# Patient Record
Sex: Female | Born: 2005 | Race: White | Hispanic: No | Marital: Single | Smoking: Never smoker
Health system: Southern US, Community
[De-identification: ages and names within clinical notes are randomized; demographics above are authoritative.]

## PROBLEM LIST (undated history)

## (undated) DIAGNOSIS — N390 Urinary tract infection, site not specified: Secondary | ICD-10-CM

## (undated) HISTORY — PX: NO PAST SURGERIES: SHX2092

## (undated) HISTORY — DX: Urinary tract infection, site not specified: N39.0

---
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
## 2023-01-14 ENCOUNTER — Ambulatory Visit
Admission: RE | Admit: 2023-01-14 | Discharge: 2023-01-14 | Disposition: A | Discharge status: Home / Self Care | Payer: Medicaid Other | Source: Ambulatory Visit | Attending: Nurse Practitioner | Admitting: Nurse Practitioner

## 2023-01-14 VITALS — BP 108/78 | HR 92 | Temp 99.9°F | Resp 18

## 2023-01-14 DIAGNOSIS — J029 Acute pharyngitis, unspecified: Secondary | ICD-10-CM | POA: Diagnosis not present

## 2023-01-14 DIAGNOSIS — J069 Acute upper respiratory infection, unspecified: Secondary | ICD-10-CM

## 2023-01-14 LAB — GROUP A STREP BY PCR: Group A Strep by PCR: NOT DETECTED

## 2023-01-14 MED ORDER — IBUPROFEN 800 MG PO TABS: 800.0000 mg | ORAL_TABLET | Freq: Three times a day (TID) | ORAL | 0 refills | Status: AC

## 2023-01-14 MED ORDER — AMOXICILLIN 500 MG PO CAPS: 500.0000 mg | ORAL_CAPSULE | Freq: Two times a day (BID) | ORAL | 0 refills | Status: DC

## 2023-01-14 NOTE — ED Provider Notes (Signed)
[facility]    CSN: [id number] Arrival date & time: 01/14/23  1241      History   Chief Complaint Chief Complaint  Patient presents with   Sore Throat    My throat hurts really bad on the left side - Entered by patient    HPI Olivia Bryan is a 17 y.o. female.   Patient presents for evaluation of fever, chills and sore throat beginning 1 day ago.  Fever peaking at 101.0.  Able to tolerate food and liquids but left ear pain is elicited when swallowing.  Attempted use of BC powder which was effective and fever management.  No known sick contacts.  Denies congestion, cough, abdominal symptoms.   Past Medical History:  Diagnosis Date   Urinary tract infection    last seen for infection last week at [facility], put on ABX    Patient Active Problem List   Diagnosis Date Noted   Labor and delivery, indication for care 09/28/2021   Uterine contractions during pregnancy 09/08/2021    Past Surgical History:  Procedure Laterality Date   NO PAST SURGERIES      OB History     Gravida  1   Para      Term      Preterm      AB      Living         SAB      IAB      Ectopic      Multiple      Live Births               Home Medications    Prior to Admission medications   Medication Sig Start Date End Date Taking? Authorizing Provider  ibuprofen (ADVIL) 600 MG tablet Take 1 tablet (600 mg total) by mouth every 6 (six) hours. 09/30/21   Doreene Burke, CNM  Norethindrone Acetate-Ethinyl Estrad-FE (LOESTRIN 24 FE) 1-20 MG-MCG(24) tablet Take 1 tablet by mouth daily. 11/12/21   Glenetta Borg, CNM  Prenatal Vit-Fe Fumarate-FA (PRENATAL PO) Take by mouth.    [provider]    Family History Family History  Problem Relation Age of Onset   Thyroid disease Mother    Other Mother        palpitations   Learning disabilities Father    Hypertension Father     Social History Social History   Tobacco Use   Smoking status: Never     Passive exposure: Yes   Smokeless tobacco: Never  Vaping Use   Vaping Use: Every day  Substance Use Topics   Alcohol use: No    Alcohol/week: 0.0 standard drinks of alcohol   Drug use: No     Allergies   Patient has no known allergies.   Review of Systems Review of Systems  Constitutional:  Positive for chills and fever. Negative for activity change, appetite change, diaphoresis, fatigue and unexpected weight change.  HENT:  Positive for ear pain and sore throat. Negative for congestion, dental problem, drooling, ear discharge, facial swelling, hearing loss, mouth sores, nosebleeds, postnasal drip, rhinorrhea, sinus pressure, sinus pain, sneezing, tinnitus, trouble swallowing and voice change.   Respiratory: Negative.    Cardiovascular: Negative.   Gastrointestinal: Negative.      Physical Exam Triage Vital Signs ED Triage Vitals  Enc Vitals Group     BP 01/14/23 1257 108/78     Pulse Rate 01/14/23 1257 92     Resp 01/14/23 1257 18  Temp 01/14/23 1257 99.9 F (37.7 C)     Temp Source 01/14/23 1257 Oral     SpO2 01/14/23 1257 100 %     Weight --      Height --      Head Circumference --      Peak Flow --      Pain Score 01/14/23 1256 8     Pain Loc --      Pain Edu? --      Excl. in GC? --    No data found.  Updated Vital Signs BP 108/78 (BP Location: Left Arm)   Pulse 92   Temp 99.9 F (37.7 C) (Oral)   Resp 18   LMP 01/07/2023   SpO2 100%   Visual Acuity Right Eye Distance:   Left Eye Distance:   Bilateral Distance:    Right Eye Near:   Left Eye Near:    Bilateral Near:     Physical Exam Constitutional:      Appearance: Normal appearance. She is well-developed.  HENT:     Head: Normocephalic.     Right Ear: Tympanic membrane and ear canal normal.     Left Ear: Tympanic membrane and ear canal normal.     Nose: No congestion or rhinorrhea.     Mouth/Throat:     Mouth: Mucous membranes are moist.     Pharynx: Posterior oropharyngeal erythema  present.     Tonsils: Tonsillar exudate present. 1+ on the right. 1+ on the left.  Cardiovascular:     Rate and Rhythm: Normal rate and regular rhythm.     Pulses: Normal pulses.     Heart sounds: Normal heart sounds.  Pulmonary:     Effort: Pulmonary effort is normal.     Breath sounds: Normal breath sounds.  Skin:    General: Skin is warm and dry.  Neurological:     General: No focal deficit present.     Mental Status: She is alert and oriented to person, place, and time.      UC Treatments / Results  Labs (all labs ordered are listed, but only abnormal results are displayed) Labs Reviewed  GROUP A STREP BY PCR    EKG   Radiology No results found.  Procedures Procedures (including critical care time)  Medications Ordered in UC Medications - No data to display  Initial Impression / Assessment and Plan / UC Course  I have reviewed the triage vital signs and the nursing notes.  Pertinent labs & imaging results that were available during my care of the patient were reviewed by me and considered in my medical decision making (see chart for details).  Acute upper respiratory infection, sore throat  Vital signs are stable patient is in no signs of distress nor toxic appearing, erythema, tonsillar adenopathy and exudate are present on exam, strep PCR is negative however based on appearance of throat will prophylactically provide bacterial coverage, amoxicillin prescribed as well as ibuprofen 800 mg, discussed additional supportive measures and advised follow-up as needed Final Clinical Impressions(s) / UC Diagnoses   Final diagnoses:  Viral pharyngitis     Discharge Instructions      Your strep test today was negative, symptoms are most likely being caused by a virus and should steadily improve with time  You may gargle and spit Magic mouthwash solution every 4-6 hours as needed to provide temporary relief to your throat  You may use ibuprofen every 8 hours as  needed to provide comfort,  may take this in addition to Tylenol  May attempt  use of salt gargles throat lozenges, warm liquids, teaspoons of honey and over-the-counter clippers septic spray for comfort  You may follow-up at urgent care as needed      ED Prescriptions   None    PDMP not reviewed this encounter.   Valinda Hoar, NP 01/14/23 1341

## 2023-01-14 NOTE — Discharge Instructions (Addendum)
Your strep test today was negative, based on the  Appearance of throat will start antibiotic prophylactically  Begin amoxicillin every morning every evening for 7 days, daily you will see improvement in 48 hours if bacteria is present  You may use ibuprofen every 8 hours as needed to provide comfort, may take this in addition to Tylenol  May attempt  use of salt gargles throat lozenges, warm liquids, teaspoons of honey and over-the-counter clippers septic spray for comfort  You may follow-up at urgent care as needed

## 2023-01-14 NOTE — ED Triage Notes (Signed)
Pt presents with a sore throat that started yesterday. 

## 2023-01-18 ENCOUNTER — Ambulatory Visit
Admission: EM | Admit: 2023-01-18 | Discharge: 2023-01-18 | Disposition: A | Discharge status: Home / Self Care | Payer: Medicaid Other | Attending: Nurse Practitioner | Admitting: Nurse Practitioner

## 2023-01-18 ENCOUNTER — Ambulatory Visit: Payer: Self-pay

## 2023-01-18 DIAGNOSIS — B349 Viral infection, unspecified: Secondary | ICD-10-CM | POA: Diagnosis present

## 2023-01-18 DIAGNOSIS — J029 Acute pharyngitis, unspecified: Secondary | ICD-10-CM | POA: Diagnosis present

## 2023-01-18 DIAGNOSIS — R300 Dysuria: Secondary | ICD-10-CM | POA: Diagnosis present

## 2023-01-18 LAB — URINALYSIS, W/ REFLEX TO CULTURE (INFECTION SUSPECTED)
Glucose, UA: NEGATIVE mg/dL
Ketones, ur: 15 mg/dL — AB
Leukocytes,Ua: NEGATIVE
Nitrite: NEGATIVE
Protein, ur: 100 mg/dL — AB
Specific Gravity, Urine: >1.03 — ABNORMAL HIGH (ref 1.005–1.030)
pH: 6 (ref 5.0–8.0)

## 2023-01-18 MED ORDER — CEPHALEXIN 500 MG PO CAPS: 500.0000 mg | ORAL_CAPSULE | Freq: Two times a day (BID) | ORAL | 0 refills | Status: AC

## 2023-01-18 NOTE — ED Triage Notes (Signed)
Pt c/o chills, headache, fatigue, sore throat onset 01/14/23 pt states her strep test was negtaive and ibuprofen has been helping but still having sore throat.   Pt also c/o dysuria onset Monday last week. Pt states she was taking azo for symptoms.

## 2023-01-18 NOTE — ED Provider Notes (Signed)
[facility]    CSN: [id number] Arrival date & time: 01/18/23  1533      History   Chief Complaint Chief Complaint  Patient presents with   Sore Throat    Possible UTI - Entered by patient   Dysuria    HPI Olivia Bryan is a 17 y.o. female presents for evaluation of sore throat and cough.  Patient was seen in urgent care on 4/18 for 1 day of symptoms.  Had a negative strep PCR but given presentation with started on amoxicillin and ibuprofen.  Patient's been taking as prescribed but reports no improvement in symptoms.  She continues to report a sore throat and thinks she sees white stuff on her tonsils.  Also is endorsing fevers of 101 that comes down to 99 with ibuprofen.  Had some nausea but no vomiting or diarrhea.  In addition 2 days ago she developed urinary burning, urgency, frequency.  No hematuria, flank pain.  No vaginal discharge or STD concern.  No other concerns at this time.   Sore Throat  Dysuria Associated symptoms: fever     Past Medical History:  Diagnosis Date   Urinary tract infection    last seen for infection last week at [facility], put on ABX    Patient Active Problem List   Diagnosis Date Noted   Labor and delivery, indication for care 09/28/2021   Uterine contractions during pregnancy 09/08/2021    Past Surgical History:  Procedure Laterality Date   NO PAST SURGERIES      OB History     Gravida  1   Para      Term      Preterm      AB      Living         SAB      IAB      Ectopic      Multiple      Live Births               Home Medications    Prior to Admission medications   Medication Sig Start Date End Date Taking? Authorizing Provider  cephALEXin (KEFLEX) 500 MG capsule Take 1 capsule (500 mg total) by mouth 2 (two) times daily for 7 days. 01/18/23 01/25/23 Yes Radford Pax, NP  ibuprofen (ADVIL) 800 MG tablet Take 1 tablet (800 mg total) by mouth 3 (three) times daily. 01/14/23  Yes White, Adrienne  R, NP  Norethindrone Acetate-Ethinyl Estrad-FE (LOESTRIN 24 FE) 1-20 MG-MCG(24) tablet Take 1 tablet by mouth daily. 11/12/21   Glenetta Borg, CNM  Prenatal Vit-Fe Fumarate-FA (PRENATAL PO) Take by mouth.    [provider]    Family History Family History  Problem Relation Age of Onset   Thyroid disease Mother    Other Mother        palpitations   Learning disabilities Father    Hypertension Father     Social History Social History   Tobacco Use   Smoking status: Never    Passive exposure: Yes   Smokeless tobacco: Never  Vaping Use   Vaping Use: Every day  Substance Use Topics   Alcohol use: No    Alcohol/week: 0.0 standard drinks of alcohol   Drug use: No     Allergies   Patient has no known allergies.   Review of Systems Review of Systems  Constitutional:  Positive for fever.  HENT:  Positive for congestion and sore throat.   Respiratory:  Positive for cough.   Genitourinary:  Positive for dysuria.     Physical Exam Triage Vital Signs ED Triage Vitals  Enc Vitals Group     BP 01/18/23 1615 119/77     Pulse Rate 01/18/23 1615 (!) 109     Resp --      Temp 01/18/23 1615 99.5 F (37.5 C)     Temp Source 01/18/23 1615 Oral     SpO2 01/18/23 1615 98 %     Weight --      Height --      Head Circumference --      Peak Flow --      Pain Score 01/18/23 1614 6     Pain Loc --      Pain Edu? --      Excl. in GC? --    No data found.  Updated Vital Signs BP 119/77 (BP Location: Right Arm)   Pulse (!) 109   Temp 99.5 F (37.5 C) (Oral)   LMP 01/07/2023   SpO2 98%   Visual Acuity Right Eye Distance:   Left Eye Distance:   Bilateral Distance:    Right Eye Near:   Left Eye Near:    Bilateral Near:     Physical Exam Vitals and nursing note reviewed.  Constitutional:      General: She is not in acute distress.    Appearance: She is well-developed. She is not ill-appearing.  HENT:     Head: Normocephalic and atraumatic.     Right  Ear: Tympanic membrane and ear canal normal.     Left Ear: Tympanic membrane and ear canal normal.     Nose: Congestion present.     Mouth/Throat:     Mouth: Mucous membranes are moist.     Pharynx: Oropharynx is clear. Uvula midline. Posterior oropharyngeal erythema present. No oropharyngeal exudate.     Tonsils: No tonsillar exudate or tonsillar abscesses.     Comments: Tonsil stones present Eyes:     Conjunctiva/sclera: Conjunctivae normal.     Pupils: Pupils are equal, round, and reactive to light.  Cardiovascular:     Rate and Rhythm: Normal rate and regular rhythm.     Heart sounds: Normal heart sounds.  Pulmonary:     Effort: Pulmonary effort is normal.     Breath sounds: Normal breath sounds.  Abdominal:     Tenderness: There is no right CVA tenderness or left CVA tenderness.  Musculoskeletal:     Cervical back: Normal range of motion and neck supple.  Lymphadenopathy:     Cervical: No cervical adenopathy.  Skin:    General: Skin is warm and dry.  Neurological:     General: No focal deficit present.     Mental Status: She is alert and oriented to person, place, and time.  Psychiatric:        Mood and Affect: Mood normal.        Behavior: Behavior normal.      UC Treatments / Results  Labs (all labs ordered are listed, but only abnormal results are displayed) Labs Reviewed  URINALYSIS, W/ REFLEX TO CULTURE (INFECTION SUSPECTED) - Abnormal; Notable for the following components:      Result Value   Specific Gravity, Urine >1.030 (*)    Hgb urine dipstick TRACE (*)    Bilirubin Urine SMALL (*)    Ketones, ur 15 (*)    Protein, ur 100 (*)    Bacteria, UA MANY (*)  All other components within normal limits  URINE CULTURE    EKG   Radiology No results found.  Procedures Procedures (including critical care time)  Medications Ordered in UC Medications - No data to display  Initial Impression / Assessment and Plan / UC Course  I have reviewed the triage  vital signs and the nursing notes.  Pertinent labs & imaging results that were available during my care of the patient were reviewed by me and considered in my medical decision making (see chart for details).     Exam and symptoms with patient.  No red flags Will culture urine and stop amoxicillin and start Keflex twice daily for 7 days.  Discussed tonsil stones as well as viral pharyngitis Continue symptomatic treatment for this PCP follow-up if symptoms do not improve ER precautions reviewed and patient verbalized understanding Final Clinical Impressions(s) / UC Diagnoses   Final diagnoses:  Dysuria  Pharyngitis, unspecified etiology  Viral infection     Discharge Instructions      Stop amoxicillin and start Keflex twice daily for 7 days The clinic will contact you with results of the urine culture if it is positive Rest and fluids Follow-up with your PCP if your symptoms do not improve Please go to the ER for any worsening symptoms     ED Prescriptions     Medication Sig Dispense Auth. Provider   cephALEXin (KEFLEX) 500 MG capsule Take 1 capsule (500 mg total) by mouth 2 (two) times daily for 7 days. 14 capsule Radford Pax, NP      PDMP not reviewed this encounter.   Radford Pax, NP 01/18/23 [phone]

## 2023-01-18 NOTE — Discharge Instructions (Signed)
Stop amoxicillin and start Keflex twice daily for 7 days The clinic will contact you with results of the urine culture if it is positive Rest and fluids Follow-up with your PCP if your symptoms do not improve Please go to the ER for any worsening symptoms

## 2023-01-19 LAB — URINE CULTURE: Culture: NO GROWTH

## 2023-04-15 IMAGING — US US OB COMP +14 WK
1 series · 13 of 28 positions shown · non-contrast
Comparison: none

CLINICAL DATA: No prenatal care

EXAM:
OBSTETRICAL ULTRASOUND >14 WKS

[Series 1: us ob comp + 14 wk · 13 of 65 slices shown]
[im 3/65]
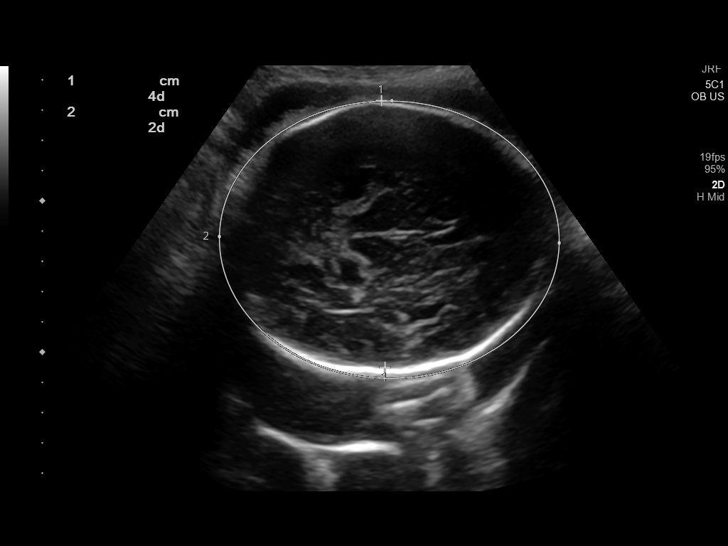
[im 8/65]
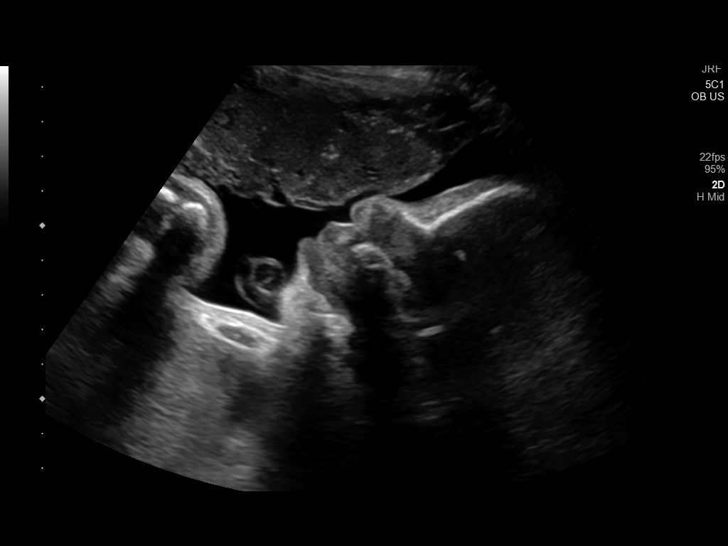
[im 12/65]
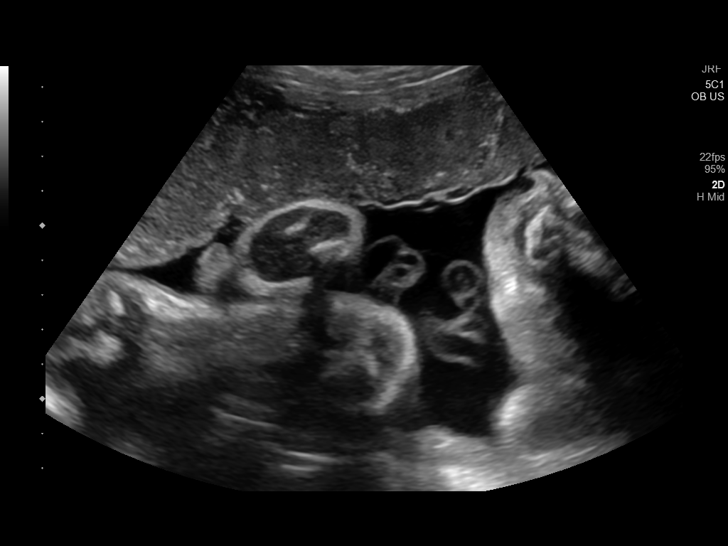
[im 17/65]
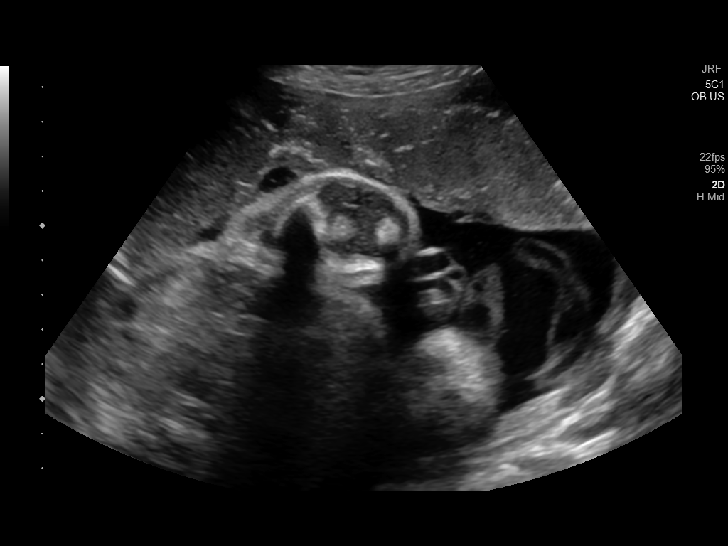
[im 22/65]
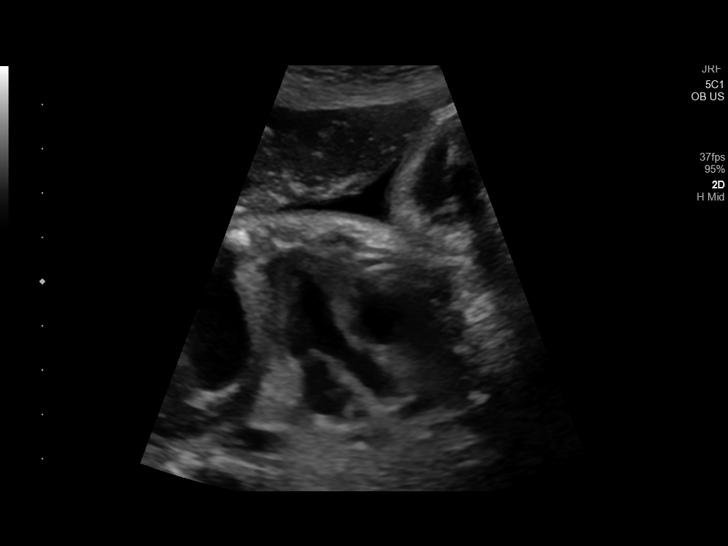
[im 27/65]
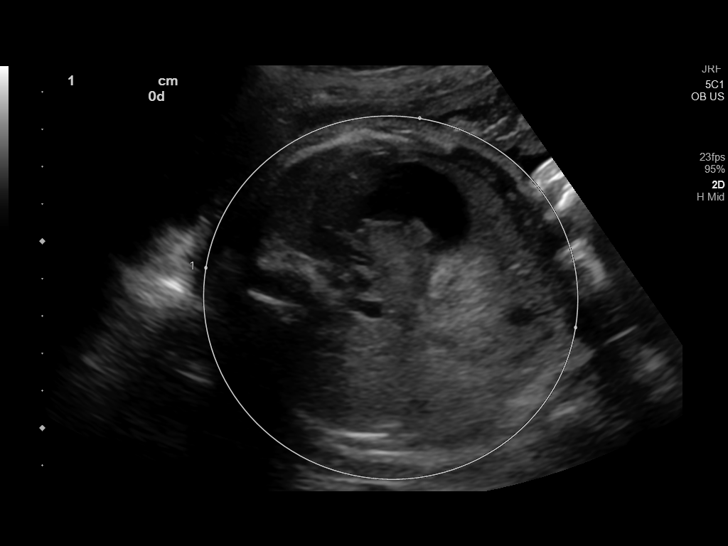
[im 34/65]
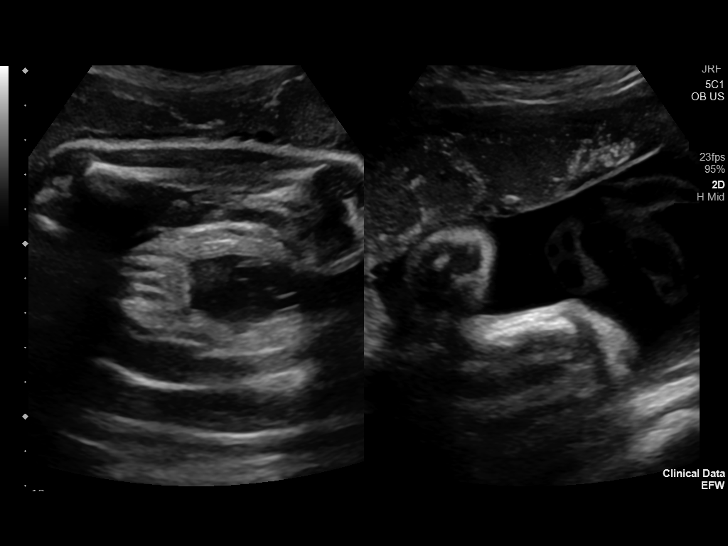
[im 38/65]
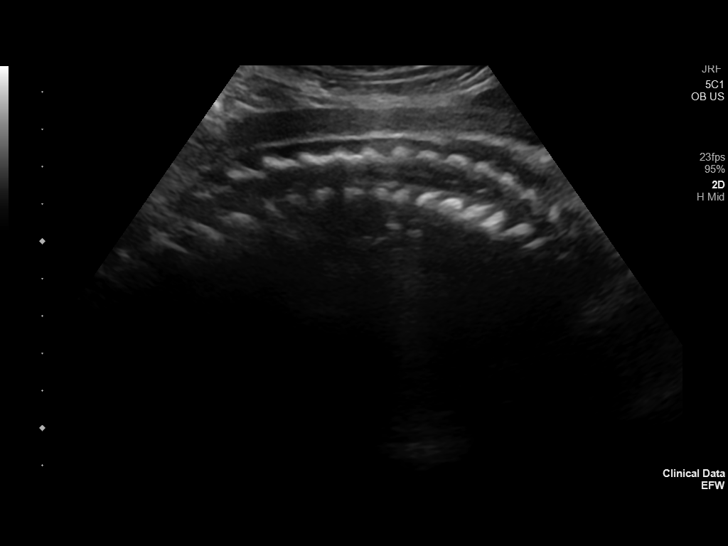
[im 43/65]
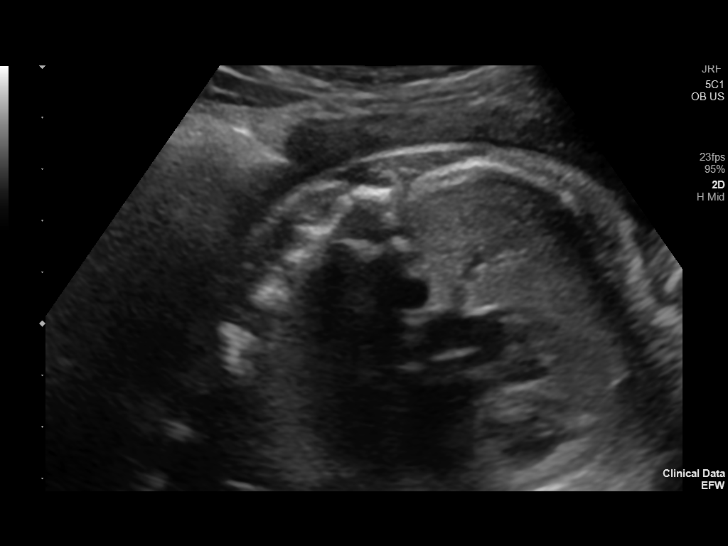
[im 48/65]
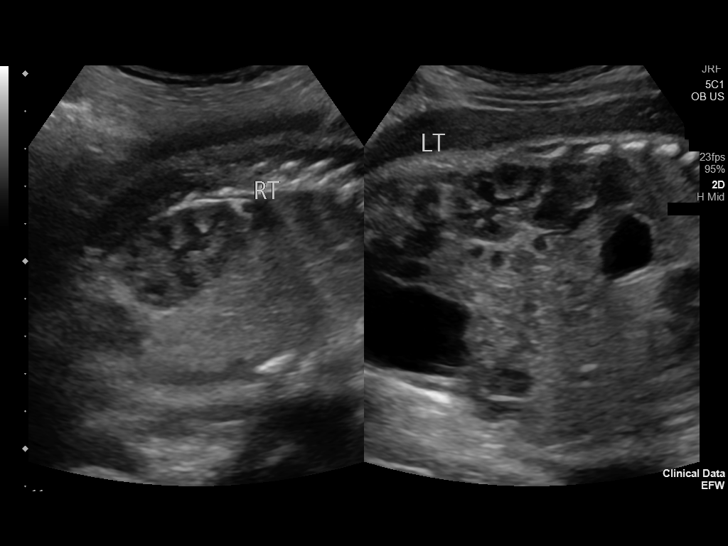
[im 53/65]
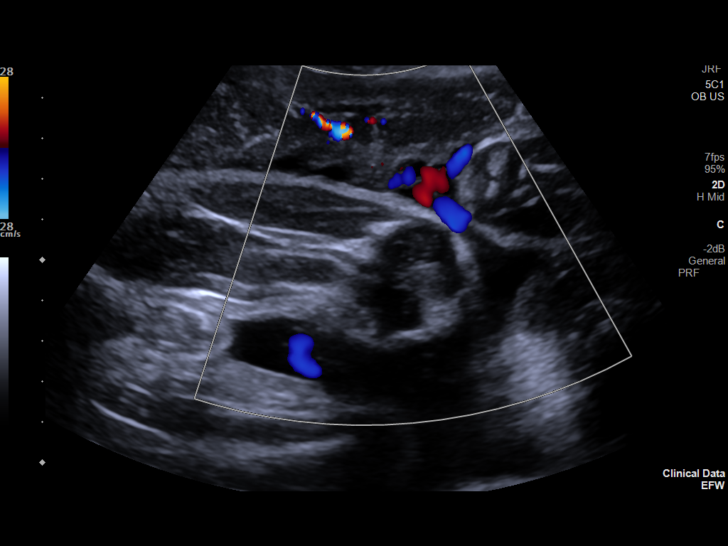
[im 57/65]
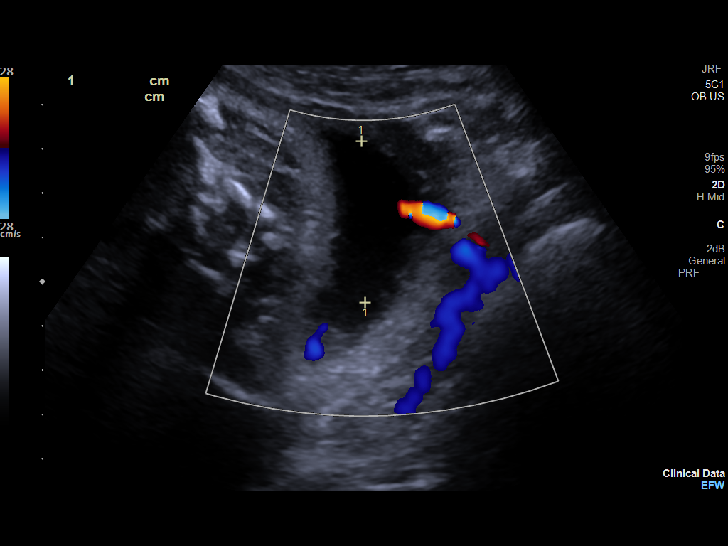
[im 62/65]
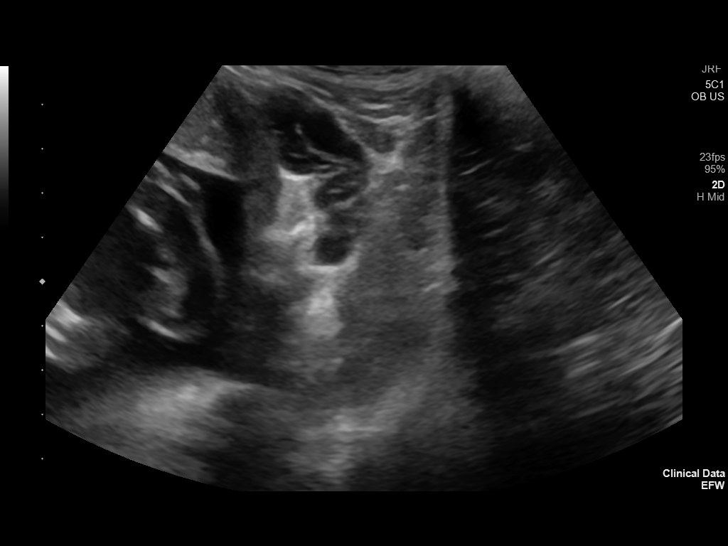

[13 of 28 positions shown; findings below may reference images not displayed]

FINDINGS: Number of Fetuses: 1

Heart Rate:  127 bpm

Movement: Yes

Presentation: Cephalic

Previa: No

Placental Location: Anterior

Amniotic Fluid (Subjective): Normal

Amniotic Fluid (Objective):

AFI = 14.3 cm (5%ile= 7.9 cm, 95%= 24.9 cm for 35 wks)

FETAL BIOMETRY

BPD: 8.9cm 36w 0d

HC:   32.3cm 36w 4d

AC:   31.1cm 35w 0d

FL:   6.8cm 34w 5d

Current Mean GA: 35w 0d US EDC: 10/13/2021

Estimated Fetal Weight:  2,616g

FETAL ANATOMY

Lateral Ventricles: Appears normal

Thalami/CSP: Appears normal

Posterior Fossa:  Not visualized

Nuchal Region: Not visualized   NFT= N/A > 20 WKS

Upper Lip: Appears normal

Spine: Appears normal

4 Chamber Heart on Left: Appears normal

LVOT: Appears normal

RVOT: Appears normal

Stomach on Left: Appears normal

3 Vessel Cord: Appears normal

Cord Insertion site: Not visualized

Kidneys: Appears normal

Bladder: Appears normal

Extremities: The left upper and left lower extremity are visualized
and appear unremarkable. The right-sided extremities are not
adequately seen due to fetal position.

Sex: Male

Technically difficult due to: Late gestational age and fetal lie

Maternal Findings:

Cervix:  Not evaluated
IMPRESSION: 1. Single live intrauterine pregnancy as above, estimated age 35
weeks and 0 days.
2. Suboptimal visualization of the posterior fossa, nuchal region,
cord insertion, and right sided extremities. The remainder of the
anatomic survey was unremarkable.

## 2023-04-16 IMAGING — US US ABDOMEN LIMITED
1 series · 14 of 25 positions shown · non-contrast
Comparison: None.

CLINICAL DATA: Elevated LFTs

EXAM:
ULTRASOUND ABDOMEN LIMITED RIGHT UPPER QUADRANT

[Series 1: us abdomen limited ruq (liver/gb) · 14 of 51 slices shown]
[im 1/51]
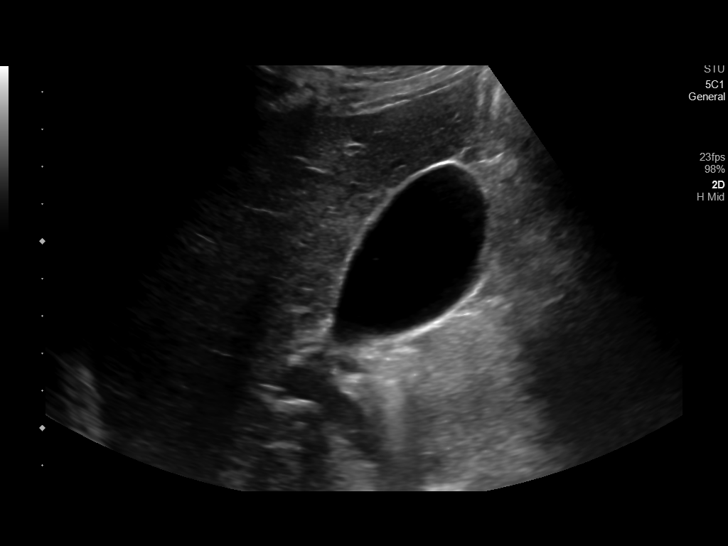
[im 5/51]
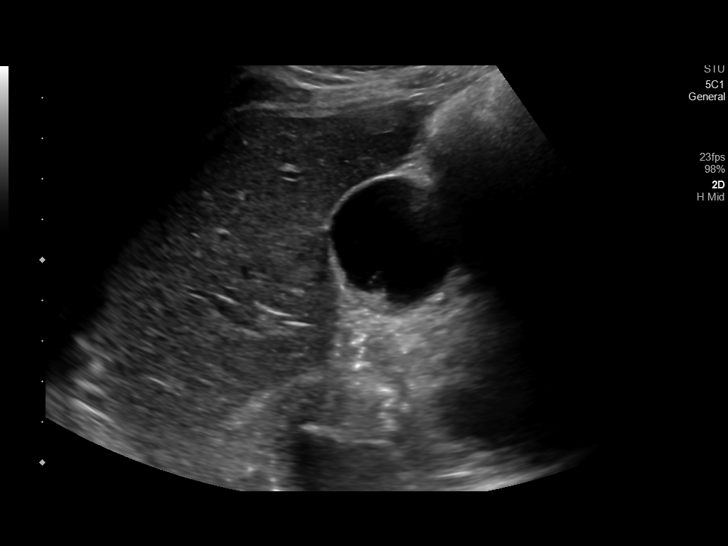
[im 9/51]
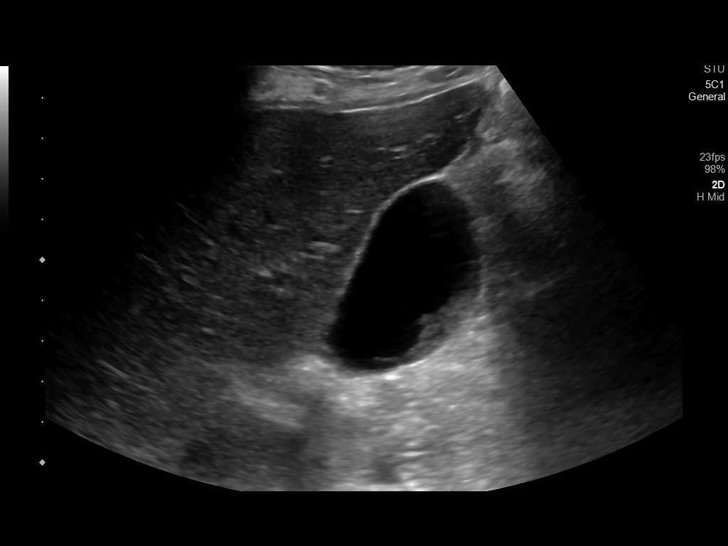
[im 13/51]
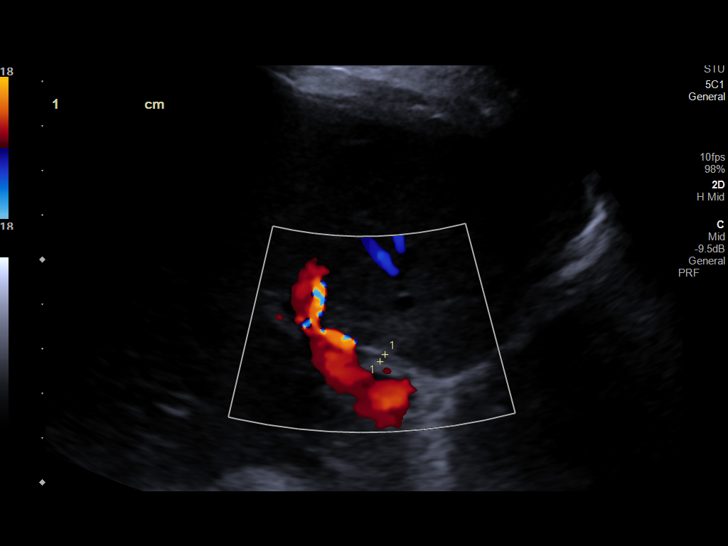
[im 17/51]
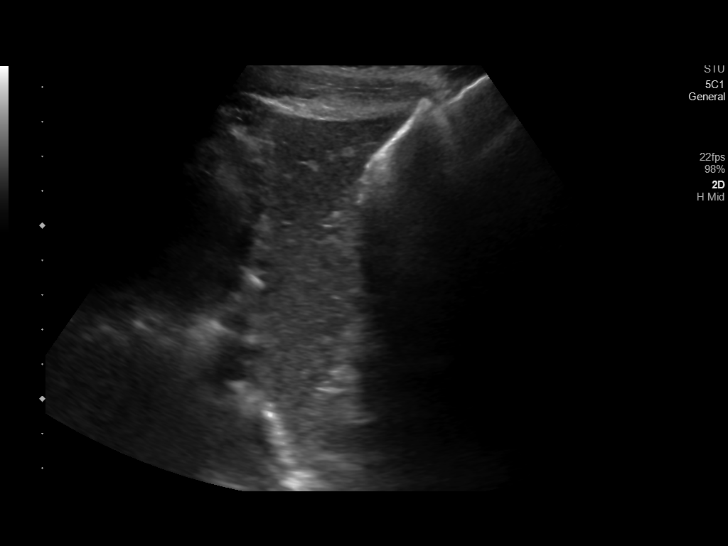
[im 19/51]
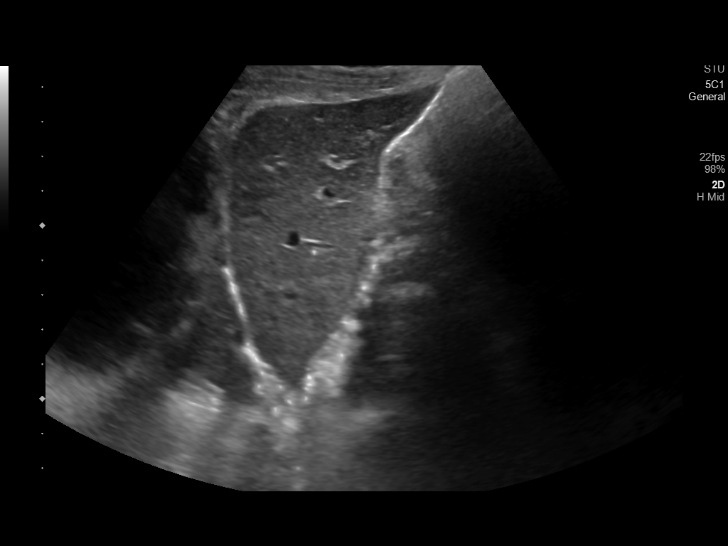
[im 23/51]
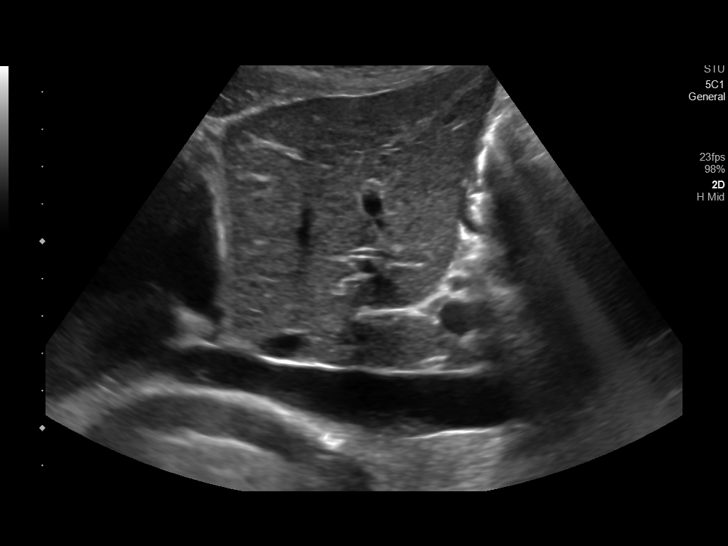
[im 28/51]
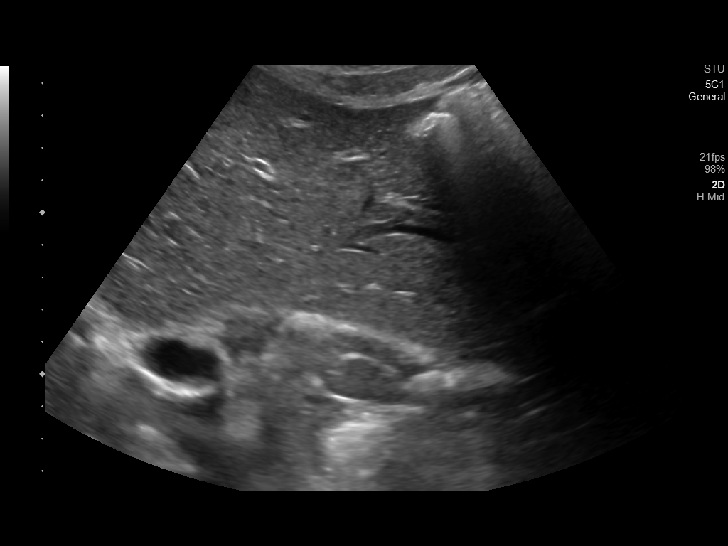
[im 32/51]
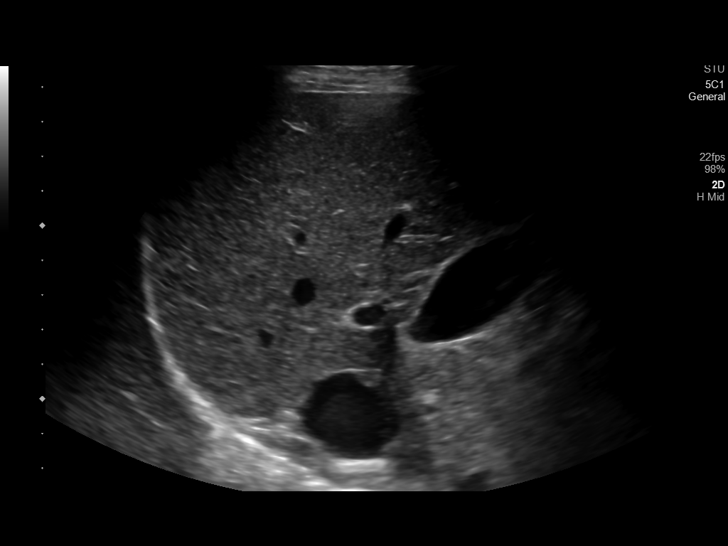
[im 34/51]
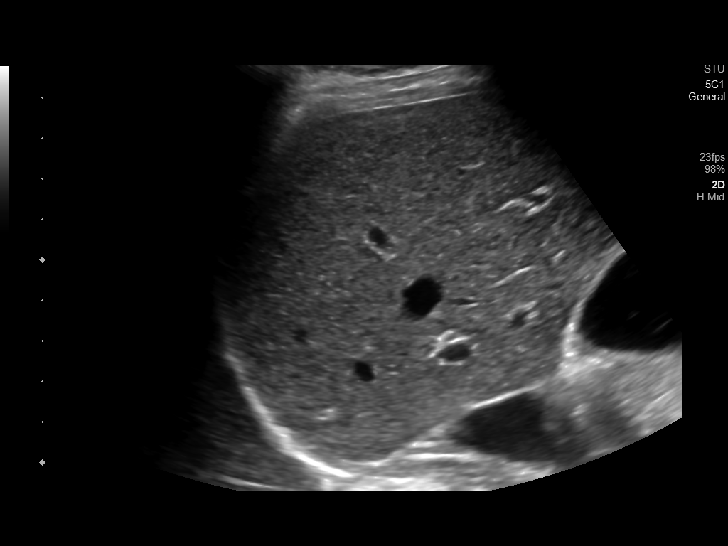
[im 38/51]
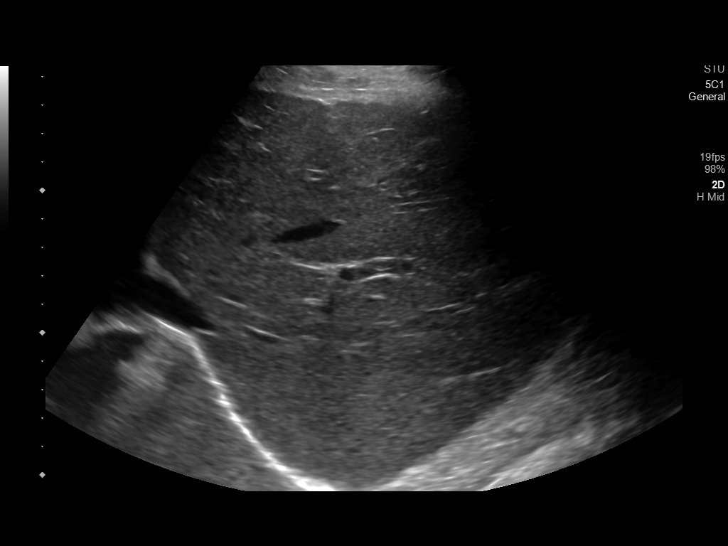
[im 42/51]
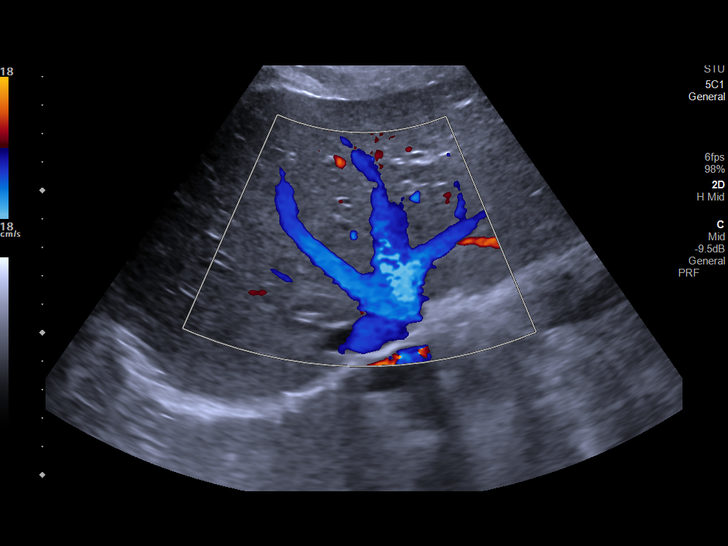
[im 46/51]
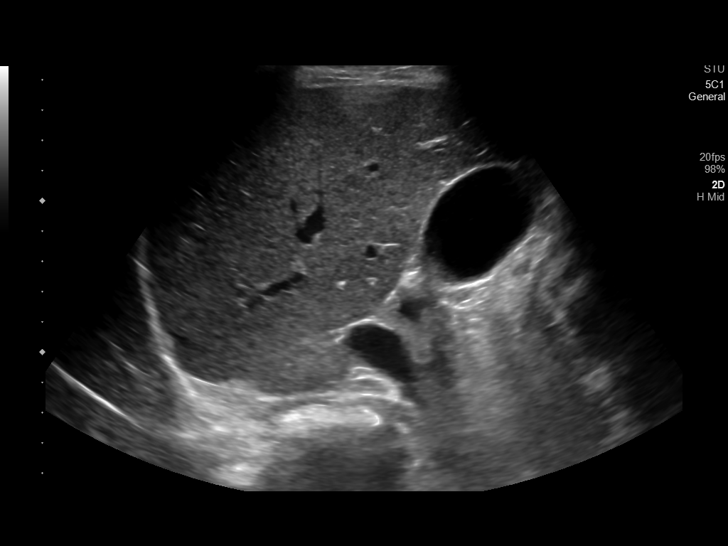
[im 51/51]
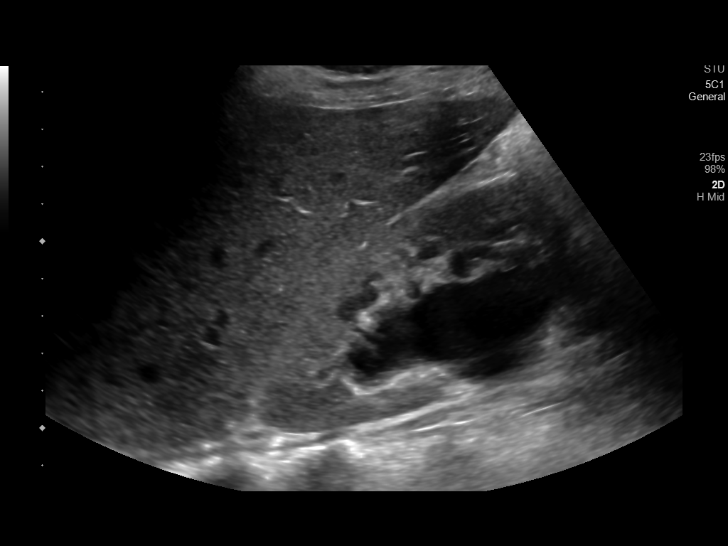

[14 of 25 positions shown; findings below may reference images not displayed]

FINDINGS: Gallbladder:

Sludge in the gallbladder. No discrete gallstones or wall thickening
visualized. No sonographic Murphy sign noted by sonographer.

Common bile duct:

Diameter: 2 mm

Liver:

No focal lesion identified. Within normal limits in parenchymal
echogenicity. Portal vein is patent on color Doppler imaging with
normal direction of blood flow towards the liver.

Other: None.
IMPRESSION: Sludge in the gallbladder. No discrete gallstones or sonographic
evidence of acute cholecystitis.
